# Patient Record
Sex: Male | Born: 1965 | Race: White | Hispanic: No | Smoking: Light tobacco smoker
Health system: Southern US, Community
[De-identification: ages and names within clinical notes are randomized; demographics above are authoritative.]

## PROBLEM LIST (undated history)

## (undated) DIAGNOSIS — F419 Anxiety disorder, unspecified: Secondary | ICD-10-CM

## (undated) HISTORY — PX: APPENDECTOMY: SHX54

## (undated) HISTORY — DX: Anxiety disorder, unspecified: F41.9

## (undated) HISTORY — PX: HERNIA REPAIR: SHX51

---

## 2000-12-17 ENCOUNTER — Emergency Department (HOSPITAL_COMMUNITY): Admission: EM | Admit: 2000-12-17 | Discharge: 2000-12-17 | Payer: Self-pay

## 2013-09-19 ENCOUNTER — Ambulatory Visit: Payer: Self-pay

## 2013-09-19 ENCOUNTER — Ambulatory Visit: Payer: Self-pay | Admitting: Family Medicine

## 2013-09-19 DIAGNOSIS — K4090 Unilateral inguinal hernia, without obstruction or gangrene, not specified as recurrent: Secondary | ICD-10-CM

## 2013-09-19 DIAGNOSIS — M25511 Pain in right shoulder: Secondary | ICD-10-CM

## 2013-09-19 DIAGNOSIS — M25519 Pain in unspecified shoulder: Secondary | ICD-10-CM

## 2013-09-19 DIAGNOSIS — M542 Cervicalgia: Secondary | ICD-10-CM

## 2013-09-19 MED ORDER — TRAMADOL HCL 50 MG PO TABS: 50.0000 mg | ORAL_TABLET | Freq: Three times a day (TID) | ORAL | Status: DC | PRN

## 2013-09-19 MED ORDER — CYCLOBENZAPRINE HCL 10 MG PO TABS: 10.0000 mg | ORAL_TABLET | Freq: Two times a day (BID) | ORAL | Status: DC | PRN
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
## 2013-09-19 NOTE — Patient Instructions (Signed)
You appear to have a neck strain and right shoulder strain.  If your shoulder does not feel better soon there may be something else going on- please be sure to let me know in this case.    You can continue to use ibuprofen as needed for pain, and also try heat and muscle rubs.  Flexeril (muscle relaxer) will help with pain and sleep.  However it can make you quite drowsy- you probably just want to use it at night.  The tramadol is also for pain; this may cause drowsiness also, so be aware.  Avoid using both tramadol and flexeril at the same time.
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
## 2016-01-01 ENCOUNTER — Encounter: Payer: Self-pay | Admitting: Internal Medicine

## 2016-01-01 ENCOUNTER — Ambulatory Visit: Payer: Self-pay | Admitting: Internal Medicine

## 2016-01-01 VITALS — BP 122/96 | HR 104 | Temp 97.7°F | Resp 16 | Ht 71.75 in | Wt 148.0 lb

## 2016-01-01 DIAGNOSIS — I10 Essential (primary) hypertension: Secondary | ICD-10-CM

## 2016-01-01 DIAGNOSIS — K4090 Unilateral inguinal hernia, without obstruction or gangrene, not specified as recurrent: Secondary | ICD-10-CM

## 2016-01-01 NOTE — Patient Instructions (Addendum)

## 2016-01-02 DIAGNOSIS — I10 Essential (primary) hypertension: Secondary | ICD-10-CM | POA: Insufficient documentation

## 2016-01-02 NOTE — Progress Notes (Signed)
   Subjective:    Patient ID: Arthur Kennedy, male    DOB: 11/26/1965, 50 y.o.   MRN: [id number]  HPI  Patient is a nice 50 yo DWM returning after many years absence for c/o increasing RIH. Patient had a LIH rpr many yrs ago by Dr Maryagnes AmosLeone.  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   Meds- None currently  No Known Allergies   Past Medical History:  Diagnosis Date  . Anxiety    Past Surgical History:  Procedure Laterality Date  . APPENDECTOMY    . HERNIA REPAIR      Review of Systems    In addition to the HPI above,  No Fever-chills,  No Headache, No changes with Vision or hearing,  No problems swallowing food or Liquids,  No Chest pain or productive Cough or Shortness of Breath,  No Abdominal pain, No Nausea or Vomitting, Bowel movements are regular,  No Blood in stool or Urine,  No dysuria,  No new skin rashes or bruises,  No new joints pains-aches,  No new weakness, tingling, numbness in any extremity,  No recent weight loss,  No polyuria, polydypsia or polyphagia,  No significant Mental Stressors.  A full 10 point Review of Systems was done, except as stated above, all other Review of Systems were negative    Objective:   Physical Exam  BP  122/96   P 104   T 97.7 F    R 16   Ht 5' 11.75"    Wt 148 lb    BMI 20.21  HEENT - Eac's patent. TM's Nl. EOM's full. PERRLA. NasoOroPharynx clear. Neck - supple. Nl Thyroid. Carotids 2+ & No bruits, nodes, JVD Chest - Clear equal BS w/o Rales, rhonchi, wheezes. Cor - Nl HS. RRR w/o sig M. PP Nl. No edema. Abd - No palpable organomegaly, masses or tenderness. BS nl.Large RIH tender and no attempt was made to try to reduce due to tenderness. MS- FROM w/o deformities. Muscle power, tone and bulk Nl. Gait  Nl. Neuro - No obvious Cr N abnormalities. Sensory, motor and Cerebellar functions appear Nl w/o focal abnormalities.    Assessment & Plan:   1. Inguinal hernia, right  - patient was advised a lifting restriction of maximum of 10 # pending surgical consultation.  - Ambulatory referral to General Surgery  ( No Charge OV)

## 2018-03-28 ENCOUNTER — Encounter (HOSPITAL_COMMUNITY): Payer: Self-pay | Admitting: *Deleted

## 2018-03-28 ENCOUNTER — Other Ambulatory Visit: Payer: Self-pay

## 2018-03-28 ENCOUNTER — Observation Stay (HOSPITAL_COMMUNITY)
Admission: EM | Admit: 2018-03-28 | Discharge: 2018-03-30 | Disposition: A | Discharge status: Home / Self Care | Payer: Self-pay | Attending: General Surgery | Admitting: General Surgery

## 2018-03-28 DIAGNOSIS — K4090 Unilateral inguinal hernia, without obstruction or gangrene, not specified as recurrent: Principal | ICD-10-CM | POA: Insufficient documentation

## 2018-03-28 DIAGNOSIS — I10 Essential (primary) hypertension: Secondary | ICD-10-CM | POA: Insufficient documentation

## 2018-03-28 DIAGNOSIS — F17290 Nicotine dependence, other tobacco product, uncomplicated: Secondary | ICD-10-CM | POA: Insufficient documentation

## 2018-03-28 LAB — CBC WITH DIFFERENTIAL/PLATELET
Abs Immature Granulocytes: 0.05 10*3/uL (ref 0.00–0.07)
Basophils Absolute: 0 10*3/uL (ref 0.0–0.1)
Basophils Relative: 0 %
EOS PCT: 0 %
Eosinophils Absolute: 0 10*3/uL (ref 0.0–0.5)
HCT: 44 % (ref 39.0–52.0)
Hemoglobin: 14.6 g/dL (ref 13.0–17.0)
Immature Granulocytes: 1 %
LYMPHS PCT: 8 %
Lymphs Abs: 0.8 10*3/uL (ref 0.7–4.0)
MCH: 29.1 pg (ref 26.0–34.0)
MCHC: 33.2 g/dL (ref 30.0–36.0)
MCV: 87.6 fL (ref 80.0–100.0)
Monocytes Absolute: 0.3 10*3/uL (ref 0.1–1.0)
Monocytes Relative: 3 %
Neutro Abs: 9.6 10*3/uL — ABNORMAL HIGH (ref 1.7–7.7)
Neutrophils Relative %: 88 %
Platelets: 246 10*3/uL (ref 150–400)
RBC: 5.02 MIL/uL (ref 4.22–5.81)
RDW: 12.9 % (ref 11.5–15.5)
WBC: 10.8 10*3/uL — ABNORMAL HIGH (ref 4.0–10.5)
nRBC: 0 % (ref 0.0–0.2)

## 2018-03-28 LAB — BASIC METABOLIC PANEL
Anion gap: 11 (ref 5–15)
BUN: 10 mg/dL (ref 6–20)
CO2: 25 mmol/L (ref 22–32)
CREATININE: 0.76 mg/dL (ref 0.61–1.24)
Calcium: 9.6 mg/dL (ref 8.9–10.3)
Chloride: 102 mmol/L (ref 98–111)
GFR calc Af Amer: >60 mL/min (ref >60)
GFR calc non Af Amer: >60 mL/min (ref >60)
Glucose, Bld: 109 mg/dL — ABNORMAL HIGH (ref 70–99)
Potassium: 3.6 mmol/L (ref 3.5–5.1)
Sodium: 138 mmol/L (ref 135–145)

## 2018-03-28 LAB — CBG MONITORING, ED: Glucose-Capillary: 102 mg/dL — ABNORMAL HIGH (ref 70–99)

## 2018-03-28 MED ORDER — ACETAMINOPHEN 325 MG PO TABS: 650.0000 mg | ORAL_TABLET | Freq: Four times a day (QID) | ORAL | Status: DC | PRN

## 2018-03-28 MED ORDER — ONDANSETRON HCL 4 MG/2ML IJ SOLN
4.0000 mg | Freq: Once | INTRAMUSCULAR | Status: AC
  Administered 2018-03-28: 4 mg via INTRAVENOUS
  Filled 2018-03-28: qty 2

## 2018-03-28 MED ORDER — HYDROMORPHONE HCL 1 MG/ML IJ SOLN
1.0000 mg | INTRAMUSCULAR | Status: AC | PRN
  Administered 2018-03-28 (×2): 1 mg via INTRAVENOUS
  Filled 2018-03-28 (×2): qty 1

## 2018-03-28 MED ORDER — ACETAMINOPHEN 650 MG RE SUPP: 650.0000 mg | Freq: Four times a day (QID) | RECTAL | Status: DC | PRN

## 2018-03-28 MED ORDER — ONDANSETRON HCL 4 MG/2ML IJ SOLN: 4.0000 mg | Freq: Four times a day (QID) | INTRAMUSCULAR | Status: DC | PRN

## 2018-03-28 MED ORDER — ENOXAPARIN SODIUM 40 MG/0.4ML ~~LOC~~ SOLN
40.0000 mg | SUBCUTANEOUS | Status: DC
  Filled 2018-03-28 (×2): qty 0.4

## 2018-03-28 MED ORDER — SODIUM CHLORIDE 0.9 % IV SOLN
INTRAVENOUS | Status: DC
  Administered 2018-03-28: 23:00:00 via INTRAVENOUS

## 2018-03-28 MED ORDER — HYDROMORPHONE HCL 1 MG/ML IJ SOLN
1.0000 mg | INTRAMUSCULAR | Status: DC | PRN
  Administered 2018-03-28: 1 mg via INTRAVENOUS
  Filled 2018-03-28: qty 1

## 2018-03-28 MED ORDER — SIMETHICONE 80 MG PO CHEW
40.0000 mg | CHEWABLE_TABLET | Freq: Four times a day (QID) | ORAL | Status: DC | PRN
  Filled 2018-03-28: qty 1

## 2018-03-28 MED ORDER — ONDANSETRON 4 MG PO TBDP: 4.0000 mg | ORAL_TABLET | Freq: Four times a day (QID) | ORAL | Status: DC | PRN

## 2018-03-28 NOTE — ED Provider Notes (Signed)
[facility] EMERGENCY DEPARTMENT Provider Note   CSN: [id number] Arrival date & time: 03/28/18  1835     History   Chief Complaint Chief Complaint  Patient presents with  . Hernia    HPI Arthur Kennedy is a 52 y.o. male.  HPI Patient reports history of prior inguinal hernia.  He reports that typically he can reduce his own hernia.  This is been a periodic occurrence for over 7 years.  Normally, the hernia comes out if he feels like he has gotten off track on his diet and misses a meal.  He denies it occurs with straining at stool.  He reports that today he was at work and it started to come out.  By about 3 PM the hernia was becoming painful.  He went home and tried to rest and reduce it but by 4 PM it was extremely hard and much larger than usual.  Since then the pain is increased.  He has not had vomiting but has had some nausea.  He has been doing some belching.  Patient denies his been having constipation or change in bowel movements.  He reports he did get advised by his family doctor a couple of years ago to see surgery for repair but ended up not getting evaluated.  His wife explains this was due to problems with insurance. Past Medical History:  Diagnosis Date  . Anxiety     Patient Active Problem List   Diagnosis Date Noted  . Right inguinal hernia 03/28/2018  . Labile HTN 01/02/2016    Past Surgical History:  Procedure Laterality Date  . APPENDECTOMY    . HERNIA REPAIR          Home Medications    Prior to Admission medications   Not on File    Family History No family history on file.  Social History Social History   Tobacco Use  . Smoking status: Light Tobacco Smoker    Types: Pipe  . Smokeless tobacco: Never Used  Substance Use Topics  . Alcohol use: No  . Drug use: No     Allergies   Patient has no known allergies.   Review of Systems Review of Systems 10 Systems reviewed and are negative for acute change except as  noted in the HPI.   Physical Exam Updated Vital Signs BP (!) 126/96   Pulse 78   Temp 97.8 F (36.6 C) (Oral)   Resp 14   SpO2 96%   Physical Exam  Constitutional: He is oriented to person, place, and time.  Patient is lying in fetal position on the left side.  He does not have respiratory distress.  He is alert and appropriate.  Follows commands appropriately.  HENT:  Head: Normocephalic and atraumatic.  Eyes: EOM are normal.  Cardiovascular: Normal rate, regular rhythm, normal heart sounds and intact distal pulses.  Pulmonary/Chest: Effort normal and breath sounds normal.  Abdominal:  Abdomen is soft.  Patient endorses some mild discomfort to palpation of the suprapubic area and to the right.  No guarding.  Genitourinary:  Genitourinary Comments: Patient has large firm mass in the scrotum on the right side.  This includes a large mass over the right inguinal canal.  Total length is approximately 20 cm by width of 10 cm of extremely hard hernia.  Very tender to palpation.  Penis is normal and left testicle normal.  Musculoskeletal: Normal range of motion. He exhibits no edema or tenderness.  Neurological: He is  alert and oriented to person, place, and time. He exhibits normal muscle tone. Coordination normal.  Skin: Skin is warm and dry.  Psychiatric: He has a normal mood and affect.     ED Treatments / Results  Labs (all labs ordered are listed, but only abnormal results are displayed) Labs Reviewed  BASIC METABOLIC PANEL - Abnormal; Notable for the following components:      Result Value   Glucose, Bld 109 (*)    All other components within normal limits  CBC WITH DIFFERENTIAL/PLATELET - Abnormal; Notable for the following components:   WBC 10.8 (*)    Neutro Abs 9.6 (*)    All other components within normal limits  CBG MONITORING, ED - Abnormal; Notable for the following components:   Glucose-Capillary 102 (*)    All other components within normal limits  HIV  ANTIBODY (ROUTINE TESTING W REFLEX)    EKG None  Radiology No results found.  Procedures Hernia reduction Date/Time: 03/28/2018 11:39 PM Performed by: Arby BarrettePfeiffer, Phelan Goers, MD Authorized by: Arby BarrettePfeiffer, Kewana Sanon, MD  Consent: The procedure was performed in an emergent situation. Verbal consent obtained. Risks and benefits: risks, benefits and alternatives were discussed Consent given by: patient Patient understanding: patient states understanding of the procedure being performed Patient identity confirmed: verbally with patient Local anesthesia used: no  Anesthesia: Local anesthesia used: no  Sedation: Patient sedated: no  Patient tolerance: Patient tolerated the procedure well with no immediate complications Comments: Patient was given Dilaudid in 1 mg increments over the course of the procedure.  Total of 3 mg of Dilaudid administered.  Patient maintained normal mental status without respiratory depression.  Hernia was very large and firm.  It required extensive manipulation and continuous pressure.  Reduction took approximately 15 minutes.  Patient tolerated this well.  And once reduced pain improved significantly.    (including critical care time)  Medications Ordered in ED Medications  HYDROmorphone (DILAUDID) injection 1 mg (1 mg Intravenous Given 03/28/18 2113)  enoxaparin (LOVENOX) injection 40 mg (has no administration in time range)  0.9 %  sodium chloride infusion ( Intravenous Rate/Dose Verify 03/28/18 2338)  acetaminophen (TYLENOL) tablet 650 mg (has no administration in time range)    Or  acetaminophen (TYLENOL) suppository 650 mg (has no administration in time range)  ondansetron (ZOFRAN-ODT) disintegrating tablet 4 mg (has no administration in time range)    Or  ondansetron (ZOFRAN) injection 4 mg (has no administration in time range)  simethicone (MYLICON) chewable tablet 40 mg (has no administration in time range)  HYDROmorphone (DILAUDID) injection 1 mg (1 mg  Intravenous Given 03/28/18 2058)  ondansetron (ZOFRAN) injection 4 mg (4 mg Intravenous Given 03/28/18 2056)     Initial Impression / Assessment and Plan / ED Course  I have reviewed the triage vital signs and the nursing notes.  Pertinent labs & imaging results that were available during my care of the patient were reviewed by me and considered in my medical decision making (see chart for details).    Consult: Reviewed Dr. Dwain SarnaWakefield, will admit for observation and probable surgical repair.  Patient presented with very large and firm hernia.  It had been out for a number of hours prior to arrival.  With extensive manipulation pain control and was able to reduce his hernia.  At this time, patient is nontoxic.  He will however need observation and surgical consultation.  Final Clinical Impressions(s) / ED Diagnoses   Final diagnoses:  Right inguinal hernia    ED Discharge  Orders    None       Arby Barrette, MD 03/31/18 [phone]

## 2018-03-28 NOTE — H&P (Signed)
Arthur Kennedy is an 52 y.o. male.   Chief Complaint: inguinal hernia HPI: [age] yom who has known rih for almost 10 years. Always reducible. Larger over time.  No issues. Never sought to have repaired. He was working today and about 130 it starting coming out.  By about three the hernia was out and he was not able to reduce it. It was tender. He had no n/v still having bowel function.  Nothing making better and came to er.  He was seen by er staff who were able to reduce the hernia somewhere about six hours after it was out.  He now states he feels ok, has no abd pain and hernia is now reduced.  I was asked to see him.    Past Medical History:  Diagnosis Date  . Anxiety     Past Surgical History:  Procedure Laterality Date  . APPENDECTOMY    . HERNIA REPAIR    this was lih as child   No family history on file. Social History:  reports that he has been smoking pipe. He has never used smokeless tobacco. He reports that he does not drink alcohol or use drugs.  Allergies: No Known Allergies  meds reviewed  Results for orders placed or performed during the hospital encounter of 03/28/18 (from the past 48 hour(s))  CBG monitoring, ED     Status: Abnormal   Collection Time: 03/28/18  9:29 PM  Result Value Ref Range   Glucose-Capillary 102 (H) 70 - 99 mg/dL  CBC with Differential     Status: Abnormal   Collection Time: 03/28/18  9:38 PM  Result Value Ref Range   WBC 10.8 (H) 4.0 - 10.5 K/uL   RBC 5.02 4.22 - 5.81 MIL/uL   Hemoglobin 14.6 13.0 - 17.0 g/dL   HCT 60.444.0 54.039.0 - 98.152.0 %   MCV 87.6 80.0 - 100.0 fL   MCH 29.1 26.0 - 34.0 pg   MCHC 33.2 30.0 - 36.0 g/dL   RDW 19.112.9 47.811.5 - 29.515.5 %   Platelets 246 150 - 400 K/uL   nRBC 0.0 0.0 - 0.2 %   Neutrophils Relative % 88 %   Neutro Abs 9.6 (H) 1.7 - 7.7 K/uL   Lymphocytes Relative 8 %   Lymphs Abs 0.8 0.7 - 4.0 K/uL   Monocytes Relative 3 %   Monocytes Absolute 0.3 0.1 - 1.0 K/uL   Eosinophils Relative 0 %   Eosinophils Absolute 0.0 0.0  - 0.5 K/uL   Basophils Relative 0 %   Basophils Absolute 0.0 0.0 - 0.1 K/uL   Immature Granulocytes 1 %   Abs Immature Granulocytes 0.05 0.00 - 0.07 K/uL    Comment: Performed at [facility], [address] [place], [place] [address]   No results found.  Review of Systems  Gastrointestinal: Negative for abdominal pain, constipation, diarrhea, nausea and vomiting.  All other systems reviewed and are negative.   Blood pressure (!) 167/86, pulse (!) 52, temperature 97.8 F (36.6 C), temperature source Oral, resp. rate 14, SpO2 100 %. A/o times three [place]/at No scleral icterus Neck supple without cervical lad cv rrr no murmur Lungs clear bilaterally abd soft nt/nd bs present No lih, +rih Penis nl Ext nontender, good distal pulses   Assessment/Plan RIH, incarcerated now reduced  I think due to length of incarceration it would be reasonable to watch overnight. He is nontender now with normal vital signs. I think having him stay and just repairing this  tomorrow is best option and he is agreeable.   Emelia Loron, MD 03/28/2018, 10:03 PM

## 2018-03-28 NOTE — ED Notes (Signed)
ED Provider at bedside for hernia reduction.

## 2018-03-28 NOTE — ED Triage Notes (Signed)
Pt reports inguinal hernia that he's had for the past year. Pt was working today ("lots of walking, but not much lifting") and has had increased pain since this occurred

## 2018-03-28 NOTE — ED Notes (Signed)
Pt given apple juice per request.

## 2018-03-29 ENCOUNTER — Encounter (HOSPITAL_COMMUNITY)
Admission: EM | Disposition: A | Discharge status: Home / Self Care | Payer: Self-pay | Source: Home / Self Care | Attending: Emergency Medicine

## 2018-03-29 ENCOUNTER — Observation Stay (HOSPITAL_COMMUNITY): Payer: Self-pay | Admitting: Certified Registered"

## 2018-03-29 ENCOUNTER — Encounter (HOSPITAL_COMMUNITY): Payer: Self-pay | Admitting: Certified Registered"

## 2018-03-29 HISTORY — PX: INGUINAL HERNIA REPAIR: SHX194

## 2018-03-29 LAB — HIV ANTIBODY (ROUTINE TESTING W REFLEX): HIV Screen 4th Generation wRfx: NONREACTIVE

## 2018-03-29 SURGERY — REPAIR, HERNIA, INGUINAL, ADULT
Anesthesia: General | Site: Inguinal | Laterality: Right

## 2018-03-29 MED ORDER — CEFAZOLIN SODIUM-DEXTROSE 2-3 GM-%(50ML) IV SOLR
INTRAVENOUS | Status: DC | PRN
  Administered 2018-03-29: 2 g via INTRAVENOUS

## 2018-03-29 MED ORDER — DEXAMETHASONE SODIUM PHOSPHATE 10 MG/ML IJ SOLN
INTRAMUSCULAR | Status: DC | PRN
  Administered 2018-03-29: 10 mg via INTRAVENOUS

## 2018-03-29 MED ORDER — FENTANYL CITRATE (PF) 100 MCG/2ML IJ SOLN: 25.0000 ug | INTRAMUSCULAR | Status: DC | PRN

## 2018-03-29 MED ORDER — DEXAMETHASONE SODIUM PHOSPHATE 10 MG/ML IJ SOLN
INTRAMUSCULAR | Status: AC
  Filled 2018-03-29: qty 1

## 2018-03-29 MED ORDER — BUPIVACAINE-EPINEPHRINE 0.25% -1:200000 IJ SOLN
INTRAMUSCULAR | Status: DC | PRN
  Administered 2018-03-29: 20 mL

## 2018-03-29 MED ORDER — OXYCODONE HCL 5 MG PO TABS: 5.0000 mg | ORAL_TABLET | ORAL | Status: DC | PRN

## 2018-03-29 MED ORDER — LACTATED RINGERS IV SOLN
INTRAVENOUS | Status: DC | PRN
  Administered 2018-03-29: 09:00:00 via INTRAVENOUS

## 2018-03-29 MED ORDER — ONDANSETRON HCL 4 MG/2ML IJ SOLN
INTRAMUSCULAR | Status: DC | PRN
  Administered 2018-03-29: 4 mg via INTRAVENOUS

## 2018-03-29 MED ORDER — LIDOCAINE 2% (20 MG/ML) 5 ML SYRINGE
INTRAMUSCULAR | Status: DC | PRN
  Administered 2018-03-29: 60 mg via INTRAVENOUS

## 2018-03-29 MED ORDER — PROPOFOL 10 MG/ML IV BOLUS
INTRAVENOUS | Status: DC | PRN
  Administered 2018-03-29: 30 mg via INTRAVENOUS
  Administered 2018-03-29: 170 mg via INTRAVENOUS

## 2018-03-29 MED ORDER — CEFAZOLIN SODIUM-DEXTROSE 2-4 GM/100ML-% IV SOLN
INTRAVENOUS | Status: AC
  Filled 2018-03-29: qty 100

## 2018-03-29 MED ORDER — MIDAZOLAM HCL 2 MG/2ML IJ SOLN
INTRAMUSCULAR | Status: AC
  Filled 2018-03-29: qty 2

## 2018-03-29 MED ORDER — FENTANYL CITRATE (PF) 250 MCG/5ML IJ SOLN
INTRAMUSCULAR | Status: AC
  Filled 2018-03-29: qty 5

## 2018-03-29 MED ORDER — MIDAZOLAM HCL 5 MG/5ML IJ SOLN
INTRAMUSCULAR | Status: DC | PRN
  Administered 2018-03-29: 2 mg via INTRAVENOUS

## 2018-03-29 MED ORDER — 0.9 % SODIUM CHLORIDE (POUR BTL) OPTIME
TOPICAL | Status: DC | PRN
  Administered 2018-03-29: 1000 mL

## 2018-03-29 MED ORDER — PROMETHAZINE HCL 25 MG/ML IJ SOLN: 6.2500 mg | INTRAMUSCULAR | Status: DC | PRN

## 2018-03-29 MED ORDER — PROPOFOL 10 MG/ML IV BOLUS
INTRAVENOUS | Status: AC
  Filled 2018-03-29: qty 20

## 2018-03-29 MED ORDER — FENTANYL CITRATE (PF) 100 MCG/2ML IJ SOLN
INTRAMUSCULAR | Status: DC | PRN
  Administered 2018-03-29: 100 ug via INTRAVENOUS
  Administered 2018-03-29 (×2): 50 ug via INTRAVENOUS

## 2018-03-29 MED ORDER — BUPIVACAINE HCL (PF) 0.25 % IJ SOLN
INTRAMUSCULAR | Status: AC
  Filled 2018-03-29: qty 30

## 2018-03-29 MED ORDER — ONDANSETRON HCL 4 MG/2ML IJ SOLN
INTRAMUSCULAR | Status: AC
  Filled 2018-03-29: qty 2

## 2018-03-29 SURGICAL SUPPLY — 56 items
ADH SKN CLS APL DERMABOND .7 (GAUZE/BANDAGES/DRESSINGS) ×1
BLADE CLIPPER SURG (BLADE) ×3 IMPLANT
BLADE SURG 10 STRL SS (BLADE) ×3 IMPLANT
BLADE SURG 15 STRL LF DISP TIS (BLADE) ×1 IMPLANT
BLADE SURG 15 STRL SS (BLADE) ×3
CANISTER SUCT 3000ML PPV (MISCELLANEOUS) IMPLANT
CHLORAPREP W/TINT 26ML (MISCELLANEOUS) ×3 IMPLANT
COVER SURGICAL LIGHT HANDLE (MISCELLANEOUS) ×3 IMPLANT
COVER WAND RF STERILE (DRAPES) IMPLANT
DERMABOND ADVANCED (GAUZE/BANDAGES/DRESSINGS) ×2
DERMABOND ADVANCED .7 DNX12 (GAUZE/BANDAGES/DRESSINGS) ×1 IMPLANT
DRAIN PENROSE 1/2X12 LTX STRL (WOUND CARE) ×3 IMPLANT
DRAPE LAPAROTOMY 100X72 PEDS (DRAPES) ×3 IMPLANT
DRAPE UTILITY XL STRL (DRAPES) ×3 IMPLANT
ELECT CAUTERY BLADE 6.4 (BLADE) ×3 IMPLANT
ELECT REM PT RETURN 9FT ADLT (ELECTROSURGICAL) ×3
ELECTRODE REM PT RTRN 9FT ADLT (ELECTROSURGICAL) ×1 IMPLANT
GLOVE BIO SURGEON STRL SZ 6.5 (GLOVE) ×8 IMPLANT
GLOVE BIO SURGEON STRL SZ8 (GLOVE) ×3 IMPLANT
GLOVE BIO SURGEONS STRL SZ 6.5 (GLOVE) ×4
GLOVE BIOGEL PI IND STRL 6.5 (GLOVE) IMPLANT
GLOVE BIOGEL PI IND STRL 8 (GLOVE) ×1 IMPLANT
GLOVE BIOGEL PI INDICATOR 6.5 (GLOVE) ×12
GLOVE BIOGEL PI INDICATOR 8 (GLOVE) ×2
GLOVE SURG SYN 7.5  E (GLOVE) ×6
GLOVE SURG SYN 7.5 E (GLOVE) ×3 IMPLANT
GLOVE SURG SYN 7.5 PF PI (GLOVE) IMPLANT
GOWN STRL REUS W/ TWL LRG LVL3 (GOWN DISPOSABLE) ×1 IMPLANT
GOWN STRL REUS W/ TWL XL LVL3 (GOWN DISPOSABLE) ×1 IMPLANT
GOWN STRL REUS W/TWL LRG LVL3 (GOWN DISPOSABLE) ×3
GOWN STRL REUS W/TWL XL LVL3 (GOWN DISPOSABLE) ×3
KIT BASIN OR (CUSTOM PROCEDURE TRAY) ×3 IMPLANT
KIT TURNOVER KIT B (KITS) ×3 IMPLANT
MESH HERNIA 3X6 (Mesh General) ×3 IMPLANT
NEEDLE 22X1 1/2 (OR ONLY) (NEEDLE) ×5 IMPLANT
NS IRRIG 1000ML POUR BTL (IV SOLUTION) ×3 IMPLANT
PACK SURGICAL SETUP 50X90 (CUSTOM PROCEDURE TRAY) ×3 IMPLANT
PAD ARMBOARD 7.5X6 YLW CONV (MISCELLANEOUS) ×3 IMPLANT
PENCIL BUTTON HOLSTER BLD 10FT (ELECTRODE) ×3 IMPLANT
SPECIMEN JAR SMALL (MISCELLANEOUS) IMPLANT
SPONGE LAP 18X18 X RAY DECT (DISPOSABLE) ×3 IMPLANT
SUT MNCRL AB 4-0 PS2 18 (SUTURE) ×3 IMPLANT
SUT PROLENE 2 0 CT2 30 (SUTURE) ×9 IMPLANT
SUT VIC AB 2-0 SH 27 (SUTURE) ×6
SUT VIC AB 2-0 SH 27X BRD (SUTURE) ×1 IMPLANT
SUT VIC AB 2-0 SH 27XBRD (SUTURE) IMPLANT
SUT VIC AB 3-0 SH 27 (SUTURE) ×3
SUT VIC AB 3-0 SH 27X BRD (SUTURE) ×1 IMPLANT
SUT VICRYL AB 3 0 TIES (SUTURE) ×2 IMPLANT
SYR BULB 3OZ (MISCELLANEOUS) ×3 IMPLANT
SYR CONTROL 10ML LL (SYRINGE) ×3 IMPLANT
TOWEL OR 17X24 6PK STRL BLUE (TOWEL DISPOSABLE) ×3 IMPLANT
TOWEL OR 17X26 10 PK STRL BLUE (TOWEL DISPOSABLE) IMPLANT
TUBE CONNECTING 12'X1/4 (SUCTIONS) ×1
TUBE CONNECTING 12X1/4 (SUCTIONS) ×2 IMPLANT
YANKAUER SUCT BULB TIP NO VENT (SUCTIONS) IMPLANT

## 2018-03-29 NOTE — Anesthesia Postprocedure Evaluation (Signed)
Anesthesia Post Note  Patient: Arthur Kennedy  Procedure(s) Performed: HERNIA REPAIR INGUINAL WITH MESH (Right Inguinal)     Patient location during evaluation: PACU Anesthesia Type: General Level of consciousness: sedated Pain management: pain level controlled Vital Signs Assessment: post-procedure vital signs reviewed and stable Respiratory status: spontaneous breathing and respiratory function stable Cardiovascular status: stable Postop Assessment: no apparent nausea or vomiting Anesthetic complications: no    Last Vitals:  Vitals:   03/29/18 1205 03/29/18 1241  BP: (!) 140/95 (!) 147/108  Pulse: (!) 57 77  Resp: 11 18  Temp:  37.6 C  SpO2: 96% 96%    Last Pain:  Vitals:   03/29/18 1242  TempSrc:   PainSc: 0-No pain                 Nydia Ytuarte DANIEL

## 2018-03-29 NOTE — Progress Notes (Signed)
Day of Surgery   Subjective/Chief Complaint: Hernia stayed reduced, no abdominal pain   Objective: Vital signs in last 24 hours: Temp:  [97.8 F (36.6 C)-98.4 F (36.9 C)] 98.4 F (36.9 C) (12/04 0401) Pulse Rate:  [49-82] 64 (12/04 0401) Resp:  [14-18] 18 (12/04 0401) BP: (126-187)/(80-115) 136/82 (12/04 0401) SpO2:  [93 %-100 %] 97 % (12/04 0401) Weight:  [71.2 kg] 71.2 kg (12/04 0003)    Intake/Output from previous day: 12/03 0701 - 12/04 0700 In: 587.2 [P.O.:240; I.V.:347.2] Out: 350 [Urine:350] Intake/Output this shift: No intake/output data recorded.  General appearance: alert and cooperative Resp: clear to auscultation bilaterally Cardio: regular rate and rhythm GI: soft, non-tender; bowel sounds normal; no masses,  no organomegaly RIH is reduced Lab Results:  Recent Labs    03/28/18 2138  WBC 10.8*  HGB 14.6  HCT 44.0  PLT 246   BMET Recent Labs    03/28/18 2138  NA 138  K 3.6  CL 102  CO2 25  GLUCOSE 109*  BUN 10  CREATININE 0.76  CALCIUM 9.6   PT/INR No results for input(s): LABPROT, INR in the last 72 hours. ABG No results for input(s): PHART, HCO3 in the last 72 hours.  Invalid input(s): PCO2, PO2  Studies/Results: No results found.  Anti-infectives: Anti-infectives (From admission, onward)   None      Assessment/Plan: Right inguinal hernia - will proceed with right inguinal hernia repair with mesh. Procedure, risks, and benefits discussed and he agrees. I discussed the expected post-op course.  LOS: 0 days    Liz MaladyBurke E Mattheo Swindle 03/29/2018

## 2018-03-29 NOTE — Anesthesia Procedure Notes (Signed)
Procedure Name: LMA Insertion Date/Time: 03/29/2018 9:35 AM Performed by: Shireen QuanButler, Eiley Mcginnity R, CRNA Pre-anesthesia Checklist: Patient identified, Emergency Drugs available, Suction available and Patient being monitored Patient Re-evaluated:Patient Re-evaluated prior to induction Oxygen Delivery Method: Circle System Utilized Preoxygenation: Pre-oxygenation with 100% oxygen Induction Type: IV induction Ventilation: Mask ventilation without difficulty LMA: LMA inserted LMA Size: 5.0 Number of attempts: 1 Placement Confirmation: positive ETCO2 Tube secured with: Tape Dental Injury: Teeth and Oropharynx as per pre-operative assessment

## 2018-03-29 NOTE — Anesthesia Preprocedure Evaluation (Signed)
Anesthesia Evaluation  Patient identified by MRN, date of birth, ID band Patient awake    Reviewed: Allergy & Precautions, NPO status , Patient's Chart, lab work & pertinent test results  History of Anesthesia Complications Negative for: history of anesthetic complications  Airway Mallampati: II  TM Distance: >3 FB Neck ROM: Full    Dental no notable dental hx. (+) Dental Advisory Given   Pulmonary neg pulmonary ROS, Current Smoker,    Pulmonary exam normal        Cardiovascular hypertension, Normal cardiovascular exam     Neuro/Psych PSYCHIATRIC DISORDERS Anxiety negative neurological ROS     GI/Hepatic negative GI ROS, Neg liver ROS,   Endo/Other  negative endocrine ROS  Renal/GU negative Renal ROS  negative genitourinary   Musculoskeletal negative musculoskeletal ROS (+)   Abdominal   Peds negative pediatric ROS (+)  Hematology negative hematology ROS (+)   Anesthesia Other Findings   Reproductive/Obstetrics negative OB ROS                             Anesthesia Physical Anesthesia Plan  ASA: II  Anesthesia Plan: General   Post-op Pain Management:    Induction: Intravenous  PONV Risk Score and Plan: 2 and Ondansetron and Dexamethasone  Airway Management Planned: LMA  Additional Equipment:   Intra-op Plan:   Post-operative Plan: Extubation in OR  Informed Consent: I have reviewed the patients History and Physical, chart, labs and discussed the procedure including the risks, benefits and alternatives for the proposed anesthesia with the patient or authorized representative who has indicated his/her understanding and acceptance.   Dental advisory given  Plan Discussed with: CRNA and Anesthesiologist  Anesthesia Plan Comments:         Anesthesia Quick Evaluation

## 2018-03-29 NOTE — Transfer of Care (Signed)
Immediate Anesthesia Transfer of Care Note  Patient: Arthur Kennedy  Procedure(s) Performed: HERNIA REPAIR INGUINAL WITH MESH (Right Inguinal)  Patient Location: PACU  Anesthesia Type:General  Level of Consciousness: awake and patient cooperative  Airway & Oxygen Therapy: Patient Spontanous Breathing and Patient connected to nasal cannula oxygen  Post-op Assessment: Report given to RN, Post -op Vital signs reviewed and stable and Patient moving all extremities  Post vital signs: Reviewed and stable  Last Vitals:  Vitals Value Taken Time  BP 142/95 03/29/2018 10:49 AM  Temp    Pulse 87 03/29/2018 10:50 AM  Resp 35 03/29/2018 10:50 AM  SpO2 98 % 03/29/2018 10:50 AM  Vitals shown include unvalidated device data.  Last Pain:  Vitals:   03/29/18 0401  TempSrc: Oral  PainSc:          Complications: No apparent anesthesia complications

## 2018-03-29 NOTE — ED Notes (Signed)
Foot pumps removed, pt continues to be pain-free with no nausea reported.  Pt drank approximately of water and tolerated well.

## 2018-03-29 NOTE — Op Note (Signed)
03/28/2018 - 03/29/2018  10:37 AM  PATIENT:  Arthur Kennedy  52 y.o. male  PRE-OPERATIVE DIAGNOSIS:  Right inguinal hernia  POST-OPERATIVE DIAGNOSIS:  Right inguinal hernia  PROCEDURE:  Procedure(s): HERNIA REPAIR INGUINAL WITH MESH  SURGEON:  Surgeon(s): Violeta Gelinashompson, Maverick Dieudonne, MD  ASSISTANTS: Mattie MarlinJessica Focht, PA-C   ANESTHESIA:   local and general  EBL:  No intake/output data recorded.  BLOOD ADMINISTERED:none  DRAINS: none   SPECIMEN:  none  DISPOSITION OF SPECIMEN:  N/A  COUNTS:  YES  DICTATION: .Dragon Dictation Procedure in detail: Patient presents for right inguinal hernia repair with mesh.  Informed consent was obtained.  He received intravenous antibiotics.  His site was marked.  He was brought to the operating room and general anesthesia with laryngeal mask airway was administered by the anesthesia staff.  His lower abdomen and inguinal regions were prepped and draped in sterile fashion.  Timeout procedure was performed.  Local was injected along the planned line of incision and out towards the anterior superior iliac spine.  Right inguinal incision was made.  Subcutaneous tissues were dissected down through Scarpa's fascia achieving good hemostasis.  The external oblique was identified and divided laterally the division was continued medially to the external ring.  The superior leaflet was dissected off of the transversalis and the inferior leaflet was dissected down revealing the shelving edge of the inguinal ligament.  The cord structures were encircled with a Penrose drain.  His floor was noted to be a little weak but no direct hernia was present.  His cord was then dissected revealing a large indirect hernia sac.  This was dissected free from the cord structures.  It was then high ligated with 2-0 Vicryl and excised.  A small cord lipoma was also removed.  Hernia repair was then completed with a keyhole polypropylene mesh cut to custom shape and size.  It was tacked with 0  Prolene to the tissues over the pubic tubercle medially and then in a running fashion at was sewn to the shelving edge of the inguinal ligament inferiorly.  Superiorly, it was tacked to the transversalis with interrupted 0 Prolene.  A 2 leaflets of mesh were rejoined behind the cord.  They were tacked together and down the underlying musculature with interrupted 0 Prolene.  The area was irrigated.  Hemostasis was obtained.  Additional local was injected.  The external oblique fascia was closed with running 2-0 Vicryl.  Scarpa's fascia was closed with interrupted 3-0 Vicryl.  The area was irrigated and the skin was closed with running 4-0 Monocryl followed by Dermabond.  All counts were correct.  He tolerated the procedure well without apparent complication.  He was taken recovery in stable condition. PATIENT DISPOSITION:  PACU - hemodynamically stable.   Delay start of Pharmacological VTE agent (>24hrs) due to surgical blood loss or risk of bleeding:  no  Violeta GelinasBurke Aylana Hirschfeld, MD, MPH, FACS Pager: [phone]  12/4/201910:37 AM

## 2018-03-30 ENCOUNTER — Encounter (HOSPITAL_COMMUNITY): Payer: Self-pay | Admitting: General Surgery

## 2018-03-30 LAB — CBC
HCT: 44.7 % (ref 39.0–52.0)
Hemoglobin: 14.2 g/dL (ref 13.0–17.0)
MCH: 28 pg (ref 26.0–34.0)
MCHC: 31.8 g/dL (ref 30.0–36.0)
MCV: 88.2 fL (ref 80.0–100.0)
NRBC: 0 % (ref 0.0–0.2)
Platelets: 281 10*3/uL (ref 150–400)
RBC: 5.07 MIL/uL (ref 4.22–5.81)
RDW: 13.2 % (ref 11.5–15.5)
WBC: 10.5 10*3/uL (ref 4.0–10.5)

## 2018-03-30 LAB — BASIC METABOLIC PANEL
Anion gap: 10 (ref 5–15)
BUN: 10 mg/dL (ref 6–20)
CO2: 24 mmol/L (ref 22–32)
Calcium: 9.1 mg/dL (ref 8.9–10.3)
Chloride: 104 mmol/L (ref 98–111)
Creatinine, Ser: 0.87 mg/dL (ref 0.61–1.24)
GFR calc Af Amer: >60 mL/min (ref >60)
GFR calc non Af Amer: >60 mL/min (ref >60)
Glucose, Bld: 101 mg/dL — ABNORMAL HIGH (ref 70–99)
Potassium: 3.8 mmol/L (ref 3.5–5.1)
Sodium: 138 mmol/L (ref 135–145)

## 2018-03-30 MED ORDER — OXYCODONE HCL 5 MG PO TABS: 5.0000 mg | ORAL_TABLET | Freq: Four times a day (QID) | ORAL | 0 refills | Status: AC | PRN

## 2018-03-30 NOTE — Discharge Summary (Signed)
[facility] Discharge Summary   Patient ID: Arthur Kennedy MRN: [id number] DOB/AGE: 54/10/1965 54 y.o.  Admit date: 03/28/2018 Discharge date: 03/30/2018  Admitting Diagnosis: Right inguinal hernia  Discharge Diagnosis Patient Active Problem List   Diagnosis Date Noted  . Right inguinal hernia 03/28/2018  . Labile HTN 01/02/2016    Consultants none  Imaging: No results found.  Procedures Dr. Janee Mornhompson (03/29/18) - right hernia repair with mesh  HPI: [age] yom who has known rih for almost 10 years. Always reducible. Larger over time.  No issues. Never sought to have repaired. He was working today and about 130 it starting coming out.  By about three the hernia was out and he was not able to reduce it. It was tender. He had no n/v still having bowel function.  Nothing making better and came to er.  He was seen by er staff who were able to reduce the hernia somewhere about six hours after it was out.  He now states he feels ok, has no abd pain and hernia is now reduced.  I was asked to see him.  Hospital Course:  Workup showed right inguinal hernia.  Patient was admitted and underwent procedure listed above.  Tolerated procedure well and was transferred to the floor.  Diet was advanced as tolerated.  On POD#1, the patient was voiding well, tolerating diet, ambulating well, pain well controlled, vital signs stable, incisions c/d/i and felt stable for discharge home.  Patient will follow up as outlined below and knows to call with questions or concerns.     Patient was discharged in good condition.  The [place] Substance controlled database was reviewed prior to prescribing narcotic pain medication to this patient.  Physical Exam: General:  Alert, NAD, pleasant, cooperative Cardio: RRR, S1 & S2 normal, no murmur, rubs, gallops Resp: Effort normal, lungs CTA bilaterally, no wheezes, rales, rhonchi Abd:  Soft, ND, normal bowel sounds, no TTP, right inguinal  hernia incision with glue intact is without drainage, surrounding erythema, or edema.   Skin: warm and dry  Allergies as of 03/30/2018   No Known Allergies     Medication List    TAKE these medications   oxyCODONE 5 MG immediate release tablet Commonly known as:  Oxy IR/ROXICODONE Take 1 tablet (5 mg total) by mouth every 6 (six) hours as needed (5mg  for moderate pain, 10mg  for severe pain).        Follow-up Information    [facility] [place]   Specialty:  General Surgery Why:  we are working on a follow up appointment for you. Please call our office to see when your appointment is Contact information: [address] [place] [id number] [phone]          Signed: Joyce CopaJessica L [facility] 03/30/2018, 9:11 AM Pager: [phone] Consults: [phone] Mon-Fri 7:00 am-4:30 pm Sat-Sun 7:00 am-11:30 am

## 2018-03-30 NOTE — Discharge Instructions (Signed)
[facility]  UMBILICAL OR INGUINAL HERNIA REPAIR: POST OP INSTRUCTIONS  Always review your discharge instruction sheet given to you by the facility where your surgery was performed. IF YOU HAVE DISABILITY OR FAMILY LEAVE FORMS, YOU MUST BRING THEM TO THE OFFICE FOR PROCESSING.   DO NOT GIVE THEM TO YOUR DOCTOR.  1. A  prescription for pain medication may be given to you upon discharge.  Take your pain medication as prescribed, if needed.  If narcotic pain medicine is not needed, then you may take acetaminophen (Tylenol) or ibuprofen (Advil) as needed. 2. Take your usually prescribed medications unless otherwise directed. If you need a refill on your pain medication, please contact your pharmacy.  They will contact our office to request authorization. Prescriptions will not be filled after 5 pm or on week-ends. 3. You should follow a light diet the first 24 hours after arrival home, such as soup and crackers, etc.  Be sure to include lots of fluids daily.  Resume your normal diet the day after surgery. 4.Most patients will experience some swelling and bruising around the umbilicus or in the groin and scrotum.  Ice packs and reclining will help.  Swelling and bruising can take several days to resolve.  6. It is common to experience some constipation if taking pain medication after surgery.  Increasing fluid intake and taking a stool softener (such as Colace) will usually help or prevent this problem from occurring.  A mild laxative (Milk of Magnesia or Miralax) should be taken according to package directions if there are no bowel movements after 48 hours. 7. Unless discharge instructions indicate otherwise, you may remove your bandages 24-48 hours after surgery, and you may shower at that time.  You may have steri-strips (small skin tapes) in place directly over the incision.  These strips should be left on the skin for 7-10 days.  If your surgeon used skin glue on the  incision, you may shower in 24 hours.  The glue will flake off over the next 2-3 weeks.  Any sutures or staples will be removed at the office during your follow-up visit. 8. ACTIVITIES:  You may resume regular (light) daily activities beginning the next day--such as daily self-care, walking, climbing stairs--gradually increasing activities as tolerated.  You may have sexual intercourse when it is comfortable.  Refrain from any heavy lifting or straining until approved by your doctor. Nothing over 15-20lbs for 6-8 weeks  a.You may drive when you are no longer taking prescription pain medication, you can comfortably wear a seatbelt, and you can safely maneuver your car and apply brakes. b.RETURN TO WORK:   After 2 weeks and on light duty for 4 weeks  9.You should see your doctor in the office for a follow-up appointment approximately 2-3 weeks after your surgery.  Make sure that you call for this appointment within a day or two after you arrive home to insure a convenient appointment time. 10.OTHER INSTRUCTIONS: _________________________    _____________________________________  WHEN TO CALL YOUR DOCTOR: 1. Fever over 101.0 2. Inability to urinate 3. Nausea and/or vomiting 4. Extreme swelling or bruising 5. Continued bleeding from incision. 6. Increased pain, redness, or drainage from the incision  The clinic staff is available to answer your questions during regular business hours.  Please dont hesitate to call and ask to speak to one of the nurses for clinical concerns.  If you have a medical emergency, go to the nearest emergency room or call 911.  A surgeon from [facility] is always on call at the hospital   [address] [place], [place], [place]  [address] ?  [address], [place], [place]   [address] [phone] ? [phone] ? FAX [phone] Web site: [email or web address]
